# Patient Record
Sex: Female | Born: 1990 | Marital: Single
Health system: Southern US, Community
[De-identification: ages and names within clinical notes are randomized; demographics above are authoritative.]

## PROBLEM LIST (undated history)

## (undated) DIAGNOSIS — J45909 Unspecified asthma, uncomplicated: Secondary | ICD-10-CM

---

## 2013-04-06 ENCOUNTER — Inpatient Hospital Stay: Payer: Self-pay | Admitting: Surgery

## 2013-04-06 DIAGNOSIS — R109 Unspecified abdominal pain: Secondary | ICD-10-CM

## 2013-04-06 DIAGNOSIS — R42 Dizziness and giddiness: Secondary | ICD-10-CM

## 2013-04-06 LAB — URINALYSIS, COMPLETE
Bilirubin,UR: NEGATIVE
Blood: NEGATIVE
Glucose,UR: NEGATIVE mg/dL (ref 0–75)
KETONE: NEGATIVE
Leukocyte Esterase: NEGATIVE
Nitrite: NEGATIVE
PROTEIN: NEGATIVE
Ph: 7 (ref 4.5–8.0)
RBC,UR: <1 /HPF (ref 0–5)
Specific Gravity: 1.01 (ref 1.003–1.030)
Squamous Epithelial: 1
WBC UR: 1 /HPF (ref 0–5)

## 2013-04-06 LAB — CBC WITH DIFFERENTIAL/PLATELET
Basophil #: 0 10*3/uL (ref 0.0–0.1)
Basophil %: 0.2 %
EOS PCT: 0.3 %
Eosinophil #: 0 10*3/uL (ref 0.0–0.7)
HCT: 42.2 % (ref 35.0–47.0)
HGB: 13.9 g/dL (ref 12.0–16.0)
LYMPHS ABS: 1.4 10*3/uL (ref 1.0–3.6)
LYMPHS PCT: 11 %
MCH: 31.8 pg (ref 26.0–34.0)
MCHC: 33 g/dL (ref 32.0–36.0)
MCV: 96 fL (ref 80–100)
MONOS PCT: 6.1 %
Monocyte #: 0.8 x10 3/mm (ref 0.2–0.9)
NEUTROS ABS: 10.4 10*3/uL — AB (ref 1.4–6.5)
Neutrophil %: 82.4 %
Platelet: 257 10*3/uL (ref 150–440)
RBC: 4.38 10*6/uL (ref 3.80–5.20)
RDW: 12.8 % (ref 11.5–14.5)
WBC: 12.6 10*3/uL — ABNORMAL HIGH (ref 3.6–11.0)

## 2013-04-06 LAB — COMPREHENSIVE METABOLIC PANEL
Albumin: 4.2 g/dL (ref 3.4–5.0)
Alkaline Phosphatase: 81 U/L
Anion Gap: 4 — ABNORMAL LOW (ref 7–16)
BILIRUBIN TOTAL: 0.6 mg/dL (ref 0.2–1.0)
BUN: 13 mg/dL (ref 7–18)
CALCIUM: 9.3 mg/dL (ref 8.5–10.1)
CHLORIDE: 102 mmol/L (ref 98–107)
Co2: 31 mmol/L (ref 21–32)
Creatinine: 0.79 mg/dL (ref 0.60–1.30)
GLUCOSE: 90 mg/dL (ref 65–99)
Osmolality: 273 (ref 275–301)
POTASSIUM: 3.7 mmol/L (ref 3.5–5.1)
SGOT(AST): 23 U/L (ref 15–37)
SGPT (ALT): 20 U/L (ref 12–78)
Sodium: 137 mmol/L (ref 136–145)
TOTAL PROTEIN: 8.1 g/dL (ref 6.4–8.2)

## 2013-04-06 LAB — LIPASE, BLOOD: Lipase: 169 U/L (ref 73–393)

## 2013-04-07 LAB — CBC WITH DIFFERENTIAL/PLATELET
BASOS ABS: 0 10*3/uL (ref 0.0–0.1)
Basophil %: 0.2 %
Eosinophil #: 0 10*3/uL (ref 0.0–0.7)
Eosinophil %: 0.1 %
HCT: 28.7 % — AB (ref 35.0–47.0)
HGB: 9.7 g/dL — ABNORMAL LOW (ref 12.0–16.0)
LYMPHS ABS: 1.1 10*3/uL (ref 1.0–3.6)
LYMPHS PCT: 7.6 %
MCH: 32.4 pg (ref 26.0–34.0)
MCHC: 33.9 g/dL (ref 32.0–36.0)
MCV: 96 fL (ref 80–100)
MONO ABS: 1.2 x10 3/mm — AB (ref 0.2–0.9)
Monocyte %: 8.3 %
Neutrophil #: 12.3 10*3/uL — ABNORMAL HIGH (ref 1.4–6.5)
Neutrophil %: 83.8 %
PLATELETS: 217 10*3/uL (ref 150–440)
RBC: 3 10*6/uL — ABNORMAL LOW (ref 3.80–5.20)
RDW: 12.8 % (ref 11.5–14.5)
WBC: 14.7 10*3/uL — ABNORMAL HIGH (ref 3.6–11.0)

## 2013-04-08 LAB — CBC WITH DIFFERENTIAL/PLATELET
BASOS PCT: 0.5 %
Basophil #: 0 10*3/uL (ref 0.0–0.1)
Eosinophil #: 0.1 10*3/uL (ref 0.0–0.7)
Eosinophil %: 0.9 %
HCT: 26.1 % — AB (ref 35.0–47.0)
HGB: 8.7 g/dL — ABNORMAL LOW (ref 12.0–16.0)
LYMPHS ABS: 2.6 10*3/uL (ref 1.0–3.6)
Lymphocyte %: 41.5 %
MCH: 32.2 pg (ref 26.0–34.0)
MCHC: 33.5 g/dL (ref 32.0–36.0)
MCV: 96 fL (ref 80–100)
MONO ABS: 0.6 x10 3/mm (ref 0.2–0.9)
Monocyte %: 10.1 %
NEUTROS ABS: 2.9 10*3/uL (ref 1.4–6.5)
NEUTROS PCT: 47 %
Platelet: 186 10*3/uL (ref 150–440)
RBC: 2.71 10*6/uL — ABNORMAL LOW (ref 3.80–5.20)
RDW: 13 % (ref 11.5–14.5)
WBC: 6.3 10*3/uL (ref 3.6–11.0)

## 2013-04-09 LAB — HEMOGLOBIN: HGB: 9.8 g/dL — ABNORMAL LOW (ref 12.0–16.0)

## 2013-04-19 LAB — PATHOLOGY REPORT

## 2014-04-08 ENCOUNTER — Emergency Department (HOSPITAL_BASED_OUTPATIENT_CLINIC_OR_DEPARTMENT_OTHER)
Admission: RE | Admit: 2014-04-08 | Disposition: A | Discharge status: Home / Self Care | Payer: Self-pay | Source: Emergency Department | Attending: Emergency Medicine | Admitting: Emergency Medicine

## 2014-04-08 ENCOUNTER — Encounter (HOSPITAL_BASED_OUTPATIENT_CLINIC_OR_DEPARTMENT_OTHER): Payer: Self-pay

## 2014-04-08 HISTORY — DX: Unspecified asthma, uncomplicated: J45.909

## 2014-04-08 NOTE — ED Triage Note (Signed)
Pt BIBA for ETOH intoxication. Found in [facility] bathroom, difficult to arouse. Pt admits to drinking "too much," admits to 5 drinks, denies drug use. Pt tearful throughout triage and unable to answer most questions. MD at bedside.

## 2014-04-08 NOTE — ED Provider Notes (Signed)
I have reviewed the ED nursing notes and prior records. I have reviewed the patient's past medical history/problem list, allergies, social history and medication list. I saw this patient primarily.    HPI:    This 24 year old female presents with chief complaint of alcohol intoxication.  Patient was at a [employer] and employees called EMS because of patient's altered behavior.  Patient denies drug use.    ROS: Unable to fully assess secondary to level of intoxication..    Past Medical History/Problem List:    Past Medical History    Asthma      There is no problem list on file for this patient.    Past Surgical History:  History reviewed. No pertinent past surgical history.  Medications:     No current facility-administered medications on file prior to encounter.  No current outpatient prescriptions on file prior to encounter.  Social History:    Social History   Marital Status: Single  Spouse Name: N/A     Social History Main Topics   Smoking status: Never Smoker     Smokeless tobacco: Not on file    Alcohol Use: Yes    Drug Use: No     Allergies:  Review of Patient's Allergies indicates:  No Known Allergies  Physical Exam:  BP 108/67 mmHg   Pulse 92   Temp(Src) 97.4 F   Resp 14   Wt 61.236 kg (135 lb)   SpO2 100%   LMP  (LMP Unknown)  GENERAL:  Well-developed, alert & oriented x 4, crying, intoxicated  SKIN:  Warm & Dry, no rash, no bruising  HEAD:  Atraumatic. PERRL.   ENT: Oropharynx without tonsillar exudates or redness.  NECK:  Neck supple with full ROM.  No lymphadenopathy.  LUNGS:  Clear to auscultation bilaterally.   HEART:  RRR.  No murmur.   ABDOMEN:  Soft, without distension.  Nontender to palpation.  MUSCULOSKELETAL:  No deformities.  Normal pulses.  NEUROLOGIC: Slurred speech. Normal gait and station.   PSYCHIATRIC: Upset, crying    ED Course and Medical Decision-making:  Patient is a 24 year old female who admits to drinking this evening and was sent into the ED because of behavior  while in a grocery store.  Patient is crying and emotionally upset.  Unable to get good history.  Physical exam is unremarkable. Differential diagnosis includes hypoglycemia, head trauma, toxidrome, sepsis, and others. The history and physical exam support ethanol intoxication.  Patient was observed until more sober.  Family will be in to pick her up.  Reasons to return to the ED were reviewed in detail. The patient agrees with this plan and disposition.    Diagnosis/Diagnoses:  Alcohol intoxication, uncomplicated  (primary encounter diagnosis)    Verna Czechhomas Minervia Osso M.D.

## 2014-04-08 NOTE — Narrator Note (Signed)
Pt ambulatory in hallway with steady gait, father coming to pick her up.

## 2014-04-08 NOTE — Narrator Note (Signed)
Patient Disposition    Patient education for diagnosis, medications, activity, diet and follow-up.  Patient left ED 4:07 AM.  Patient rep received written instructions.  Interpreter to provide instructions: No    Patient belongings with patient: YES    Have all existing LDAs been addressed? N/A    Have all IV infusions been stopped? N/A    Discharged to: Discharged to home with father. Ambulated out with a steady gait.

## 2014-04-08 NOTE — Discharge Instructions (Signed)
Alcohol Intoxication  Alcohol intoxication occurs when the amount of alcohol that a person has consumed impairs his or her ability to mentally and physically function. Alcohol directly impairs the normal chemical activity of the brain. Drinking large amounts of alcohol can lead to changes in mental function and behavior, and it can cause many physical effects that can be harmful.   Alcohol intoxication can range in severity from mild to very severe. Various factors can affect the level of intoxication that occurs, such as the person's age, gender, weight, frequency of alcohol consumption, and the presence of other medical conditions (such as diabetes, seizures, or heart conditions). Dangerous levels of alcohol intoxication may occur when people drink large amounts of alcohol in a short period (binge drinking). Alcohol can also be especially dangerous when combined with certain prescription medicines or "recreational" drugs.  SIGNS AND SYMPTOMS  Some common signs and symptoms of mild alcohol intoxication include:  · Loss of coordination.  · Changes in mood and behavior.  · Impaired judgment.  · Slurred speech.  As alcohol intoxication progresses to more severe levels, other signs and symptoms will appear. These may include:  · Vomiting.  · Confusion and impaired memory.  · Slowed breathing.  · Seizures.  · Loss of consciousness.  DIAGNOSIS   Your health care provider will take a medical history and perform a physical exam. You will be asked about the amount and type of alcohol you have consumed. Blood tests will be done to measure the concentration of alcohol in your blood. In many places, your blood alcohol level must be lower than 80 mg/dL (0.08%) to legally drive. However, many dangerous effects of alcohol can occur at much lower levels.   TREATMENT   People with alcohol intoxication often do not require treatment. Most of the effects of alcohol intoxication are temporary, and they go away as the alcohol naturally  leaves the body. Your health care provider will monitor your condition until you are stable enough to go home. Fluids are sometimes given through an IV access tube to help prevent dehydration.   HOME CARE INSTRUCTIONS  · Do not drive after drinking alcohol.  · Stay hydrated. Drink enough water and fluids to keep your urine clear or pale yellow. Avoid caffeine.    · Only take over-the-counter or prescription medicines as directed by your health care provider.    SEEK MEDICAL CARE IF:   · You have persistent vomiting.    · You do not feel better after a few days.  · You have frequent alcohol intoxication. Your health care provider can help determine if you should see a substance use treatment counselor.  SEEK IMMEDIATE MEDICAL CARE IF:   · You become shaky or tremble when you try to stop drinking.    · You shake uncontrollably (seizure).    · You throw up (vomit) blood. This may be bright red or may look like black coffee grounds.    · You have blood in your stool. This may be bright red or may appear as a black, tarry, bad smelling stool.    · You become lightheaded or faint.    MAKE SURE YOU:   · Understand these instructions.  · Will watch your condition.  · Will get help right away if you are not doing well or get worse.  Document Released: 12/04/2004 Document Revised: 10/27/2012 Document Reviewed: 07/30/2012  ExitCare® Patient Information ©2015 [employer]. This information is not intended to replace advice given to you by your health care provider. Make sure   you discuss any questions you have with your health care provider.

## 2014-04-08 NOTE — Narrator Note (Signed)
Pt ambulated to bathroom with steady gait. 

## 2014-07-01 NOTE — H&P (Signed)
PATIENT NAME:  Yolanda Huynh, Yolanda Huynh MR#:  [id number] DATE OF BIRTH:  20-Apr-1990  DATE OF ADMISSION:  04/06/2013  PRIMARY CARE PHYSICIAN: Not local.   ADMITTING PHYSICIAN: Dr. Michela Pitcher.   CHIEF COMPLAINT: Abdominal pain and nausea.   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 She noted some periumbilical, midepigastric and generalized abdominal pain several days ago with some mild nausea. She did not vomit. She continued to have discomfort intermittently and then last evening had the pain migrate primarily the right lower quadrant. She had increase in her pain and had difficulty lying down or moving around. The nausea was worse but again, she did not vomit. She presented to the Emergency Room for further evaluation. Work-up in the ED revealed a slightly elevated white blood cell count of 12,600. Labs were otherwise unremarkable. She had a mild left shift. CT scan was performed which revealed an enhancing dilated appendix without any periappendiceal stranding  or evidence of rupture. She had bilateral adnexal fullness suggestive of multiple ovarian cysts. The surgical service was consulted.   She denies any history of previous GI problems. She denied a history of hepatitis, yellow jaundice, pancreatitis, peptic ulcer disease, gallbladder disease or diverticulitis. She did have a history of previous abdominal discomfort and was worked up with EGD. It proved to be unremarkable. She never had a colonoscopy. She denies any other previous surgery. She does not have any ovarian problems by history, although she does have a history of irregular periods.  She has no cardiac disease, hypertension, diabetes or thyroid problems. She takes only inhalers as her medication.   ALLERGIES:  She has no significant drug allergies.   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 She has problems with tightening of her throat  intermittently without a clear-cut source. She was undergoing biofeedback treatments in order to reduce the incidence of her problems. She has not had an episode in more than a year.   REVIEW OF SYSTEMS: Otherwise unremarkable.   FAMILY HISTORY: Noncontributory.   SOCIAL HISTORY:  She does not smoke cigarettes. Drinks alcohol intermittently.   PHYSICAL EXAMINATION: GENERAL: And she is an alert, pleasant young woman, who appears to be in no significant distress. She rates her pain at a 6.  VITAL SIGNS: Blood pressure is 110/60, heart rate 80 and regular. She is febrile to 100.  HEENT: No scleral icterus. No pupillary abnormalities or facial deformities.  NECK: Supple, nontender with a midline trachea and no adenopathy.  LUNGS:  Clear with no adventitious sounds. She has normal pulmonary excursion.  CARDIAC: Reveals no murmurs or gallops to my ear. She seems to be in normal sinus rhythm.  ABDOMEN: Generally soft. She has some point tenderness in the right lower quadrant. She has some mild guarding in the right lower quadrant and some referred rebound and right lower quadrant rebound. She has active bowel sounds. She has full range of motion, no deformities. EXTREMITIES:  Good distal pulses in her lower extremity. PSYCHIATRIC: Reveals normal orientation, normal affect.   IMPRESSION: I have independently reviewed her CT scan. She does have a mildly enhancing appendix if I am identifying it correctly on CT. I am concerned about bilateral adnexal masses suggestive of almost polycystic ovarian problems. She does have a  uterus flexed to the left side. In this situation with a dilated enhancing appendix, elevated white blood cell count and point tenderness in the  right lower quadrant, I would recommend laparoscopy. I told her there is the possibility of a negative appendix and I would recommend removing her appendix at that time even if her appendix was not involved. We would examine her pelvis at the same  time. She is concerned about surgical intervention and would like to ask about antibiotic therapy. I offered her option for admission and antibiotic evaluation with the caveat that there is an increased risk of rupture and resulting problems from that complication. She is going to make a decision in the next short period of time after discussing the problem with her parents. We will be available to assist her whatever matters necessary.    ____________________________ Quentin Orealph L. Ely III, MD rle:dp D: 04/06/2013 08:02:46 ET T: 04/06/2013 09:17:45 ET JOB#: [id number]  cc: Quentin Orealph L. Ely III, MD, <Dictator> Quentin OreALPH L ELY MD ELECTRONICALLY SIGNED 04/08/2013 18:08

## 2014-07-01 NOTE — Discharge Summary (Signed)
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 CT scan confirmed the probable diagnosis. Appropriate preoperative preparation and informed consent, she was taken to surgery where she underwent laparoscopic appendectomy. Ovaries were examined as well as her tubes, and there did not appear to be any evidence of any abnormality. The appendix was dilated, injected, and inflamed. The appendix was removed without incident. The night after surgery, she had a syncopal spell with significant bradycardia and hypotension. She was felt to have suffered a vasovagal attack. However, hemoglobin the following day was 9.7, down from an admission mark of 13 grams. We treated her with IV rehydration and continued to follow her. She had mild nausea and mild lightheadedness. On the 30th, her hemoglobin was 8.7. I discussed the possibility of continued bleeding process with the family, being concerned that perhaps she was bleeding from a port site or from the mesoappendix. Ultrasound was performed that afternoon at the family's request, which did show a significant amount of fluid in the pelvis consistent with blood loss. She remained asymptomatic overnight. Her nausea has improved. Her hemoglobin is 9.8 this morning, which suggested that perhaps the 8.7 value yesterday was a bit spurious. She is no longer having any lightheadedness. She is tolerating a regular diet. We will discharge her home today with the plan of following her up in the office in 7 to 10 days' time. Bathing, activity, and driving instructions were given to the patient.   DISCHARGE MEDICATIONS: Include only her Norco every 6 hours p.r.n.   FINAL DISCHARGE DIAGNOSIS: Acute appendicitis.   SURGERY: Laparoscopic  appendectomy.   ____________________________ Quentin Orealph L. Ely III, MD rle:jcm D: 04/09/2013 14:06:08 ET T: 04/09/2013 19:24:01 ET JOB#: [id number]  cc: Quentin Orealph L. Ely III, MD, <Dictator> Quentin OreALPH L ELY MD ELECTRONICALLY SIGNED 04/18/2013 22:53

## 2014-07-01 NOTE — Op Note (Signed)
PATIENT NAME:  Yolanda Huynh, Yolanda Huynh AmasBROOKE E MR#:  [id number] DATE OF BIRTH:  [birth year]  DATE OF PROCEDURE:  04/06/2013  PREOPERATIVE DIAGNOSIS: Appendicitis.   POSTOPERATIVE DIAGNOSIS: Appendicitis.   OPERATION:  Laparoscopic appendectomy.   ANESTHESIA: General.   SURGEON: Quentin Orealph L. Ely, MD   OPERATIVE PROCEDURE: With the patient in supine position after induction of appropriate general anesthesia, the patient's abdomen was prepped with ChloraPrep and draped with sterile towels. She was placed in a head-down/feet-up position. A small infraumbilical incision was made in standard fashion and carried down bluntly through subcutaneous tissue. A Veress needle was used to cannulate the peritoneal cavity, and CO2 was insufflated to appropriate pressure measurements. When approximately 2.5 liters of CO2 were instilled, the Veress needle was withdrawn  and an [employer] port inserted in the peritoneal cavity. Intraperitoneal position was confirmed, and CO2 was re-insufflated. The right upper quadrant transverse incision was made, and an 11 mm port inserted under direct vision. The right lower quadrant was interrogated. The appendix did appear to be injected, thickened and inflamed. The mesentery did not appear to be abnormal. The cecum did not have any significant inflammatory changes. The patient was placed in fairly steep Trendelenburg, and the pelvis examined. Left and right ovaries were both identified. They were large, but did not show any evidence of any significant neoplasm, and they both appeared to be functional and normal size for her age. There was some purulent fluid on the right side. Both ovaries, the pelvis, and the appendix were photographed. A left lower quadrant transverse incision was made, and a 12 mm port inserted under direct vision. The base of the appendix was identified. The mesoappendix was taken out with 2 applications of the Endo GIA stapling device carrying a white load, and the base of  appendix was amputated with a single application of the Endo GIA stapler carrying a blue load. The appendix was captured with Endo Catch apparatus and removed without difficulty. Left lower quadrant incision fascia was closed with a 0 Vicryl in a vertical mattress fashion using the suture passer. The midline fascia and the umbilicus and right upper quadrant incision were both closed with through-and-through sutures of 0 Vicryl. Skin was closed with Monocryl, Dermabond, and nylon in the umbilicus. Sterile dressings were applied. The patient was returned to the recovery room, having tolerated the procedure well. Sponge and instrument count were correct x 2 in the operating room.    ____________________________ Carmie Endalph L. Ely III, MD rle:mr D: 04/06/2013 16:39:13 ET T: 04/06/2013 19:29:48 ET JOB#: [id number]  cc: Carmie Endalph L. Ely III, MD, <Dictator> Dr. Dennie FettersMichael Gordon, [place]  Quentin OreALPH L ELY MD ELECTRONICALLY SIGNED 04/08/2013 18:09

## 2014-11-26 IMAGING — CT CT ABD-PELV W/ CM
2 of 4 series · 17 of 46 positions shown, 19 images · IV contrast (isovue)
Comparison: None.

CLINICAL DATA: Right lower quadrant pain, leukocytosis

EXAM:
CT ABDOMEN AND PELVIS WITH CONTRAST
TECHNIQUE: Multidetector CT imaging of the abdomen and pelvis was performed
using the standard protocol following bolus administration of
intravenous contrast.
CONTRAST:  100 cc Isovue 370

[Series 2: routine abd pel with · axial · 0.61mm/px · z∈[-1004,-584]mm · 14 of 92 slices shown, 16 images]
[im 4/92  soft-tissue]
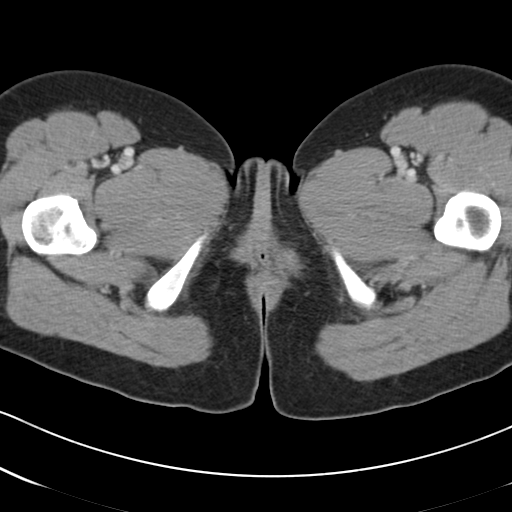
[im 4/92  bone]
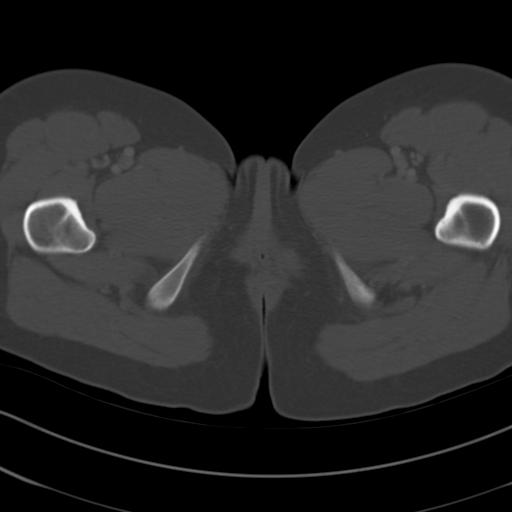
[im 11/92  soft-tissue]
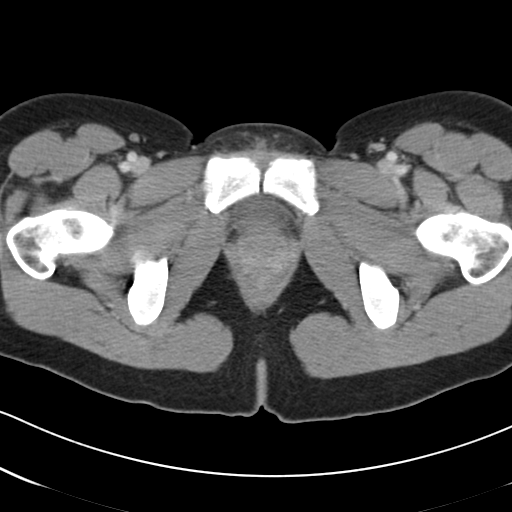
[im 19/92  soft-tissue]
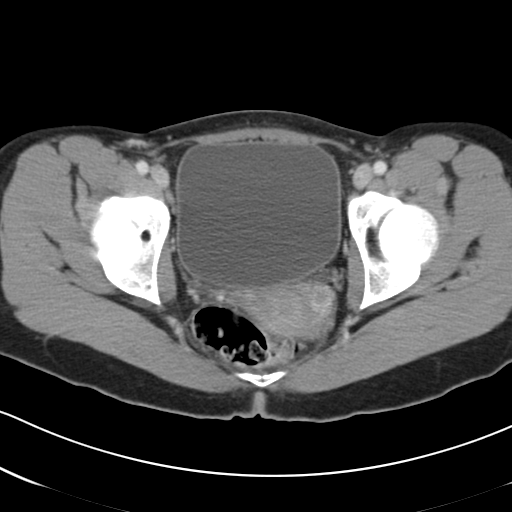
[im 26/92  soft-tissue]
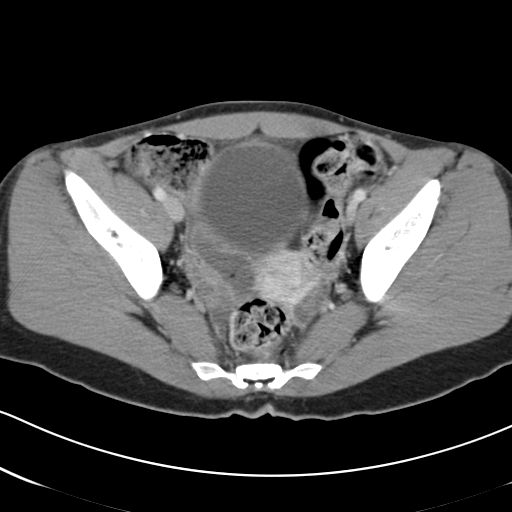
[im 30/92  soft-tissue]
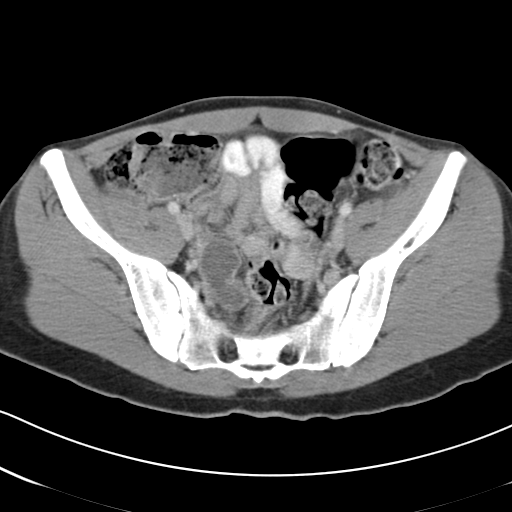
[im 37/92  soft-tissue]
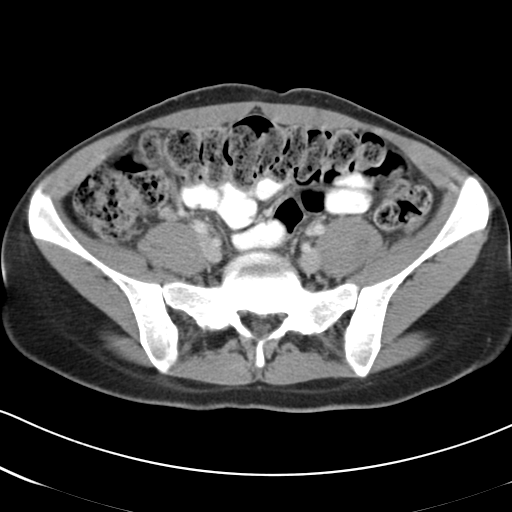
[im 44/92  soft-tissue]
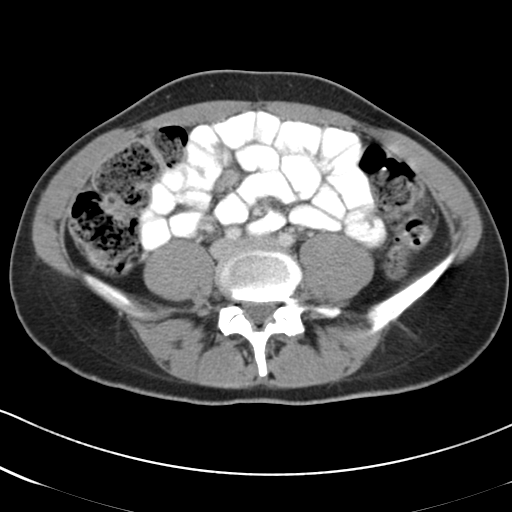
[im 48/92  soft-tissue]
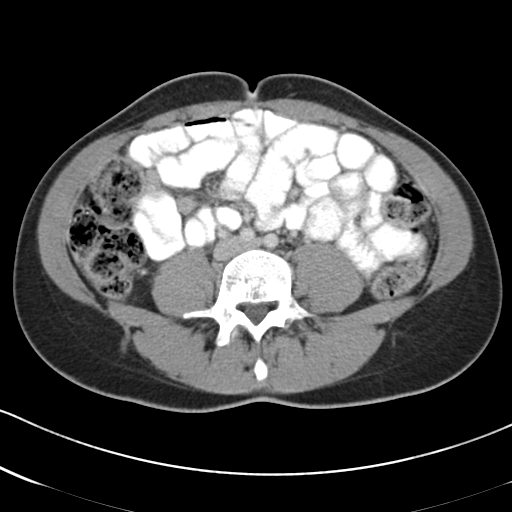
[im 55/92  soft-tissue]
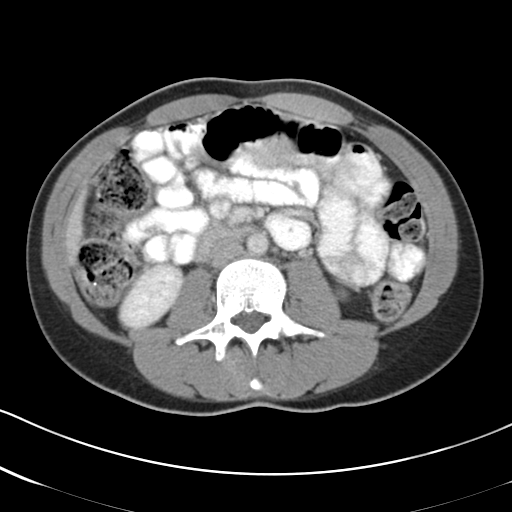
[im 55/92  bone]
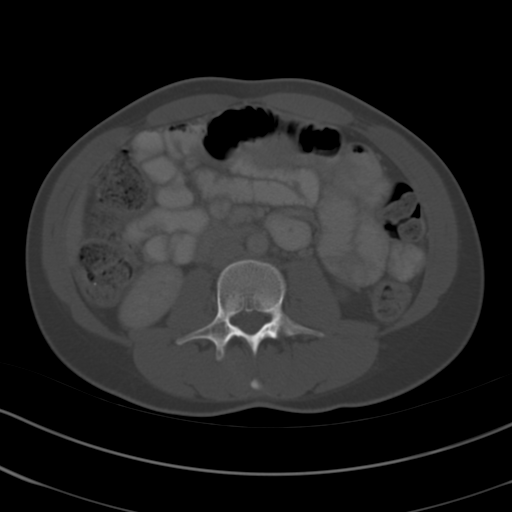
[im 62/92  soft-tissue]
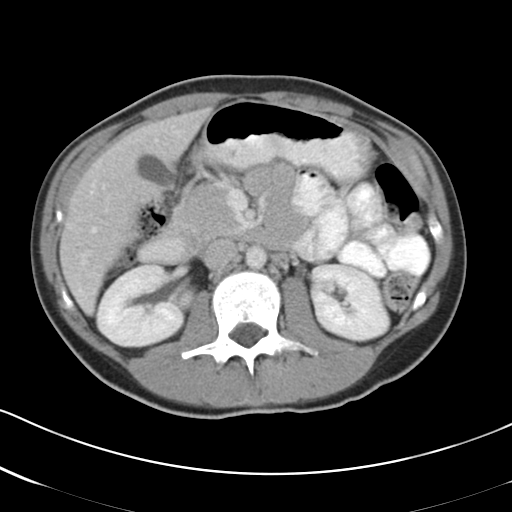
[im 70/92  soft-tissue]
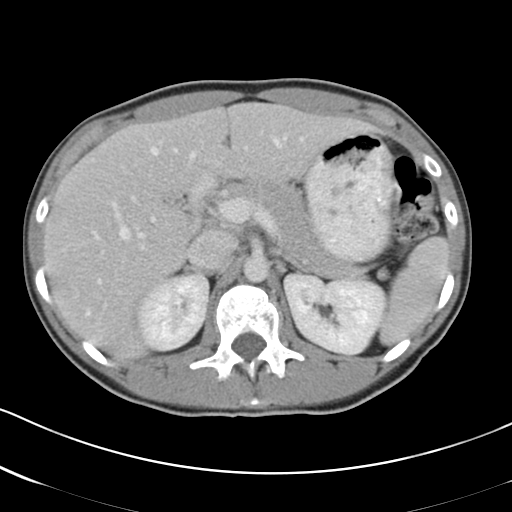
[im 73/92  soft-tissue]
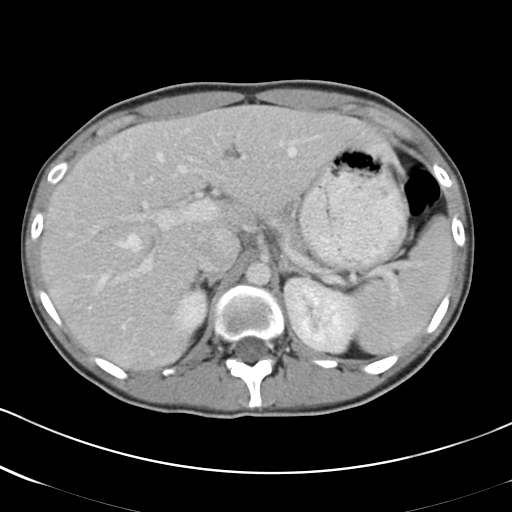
[im 81/92  soft-tissue]
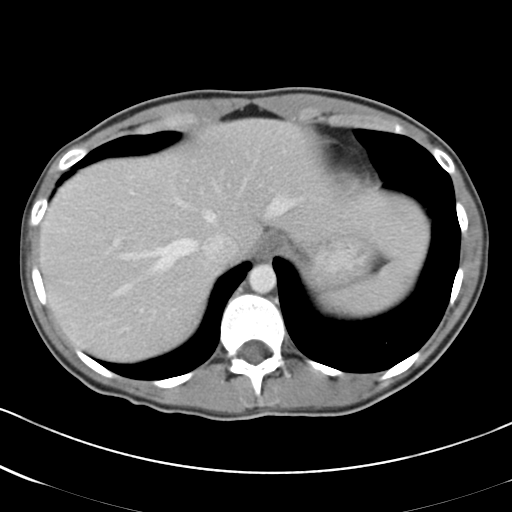
[im 88/92  soft-tissue]
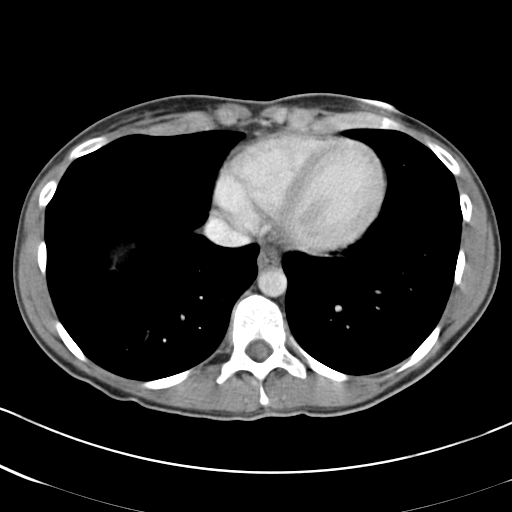

[Series 5: cor routine abd pel with · coronal · 0.91mm/px · 3 of 105 slices shown]
[im 35/105  soft-tissue]
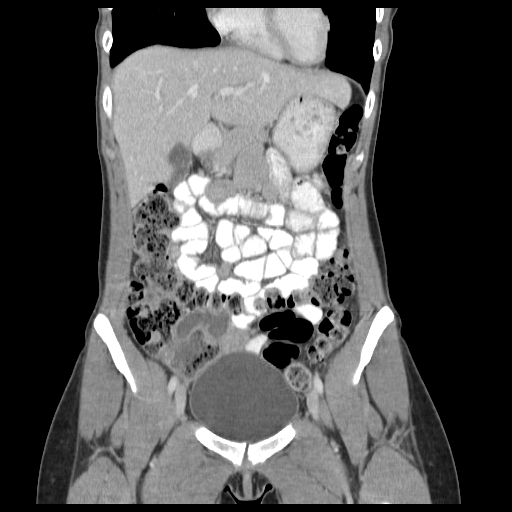
[im 47/105  soft-tissue]
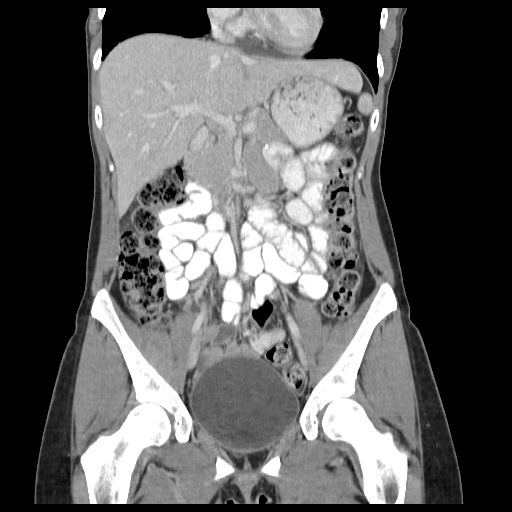
[im 58/105  soft-tissue]
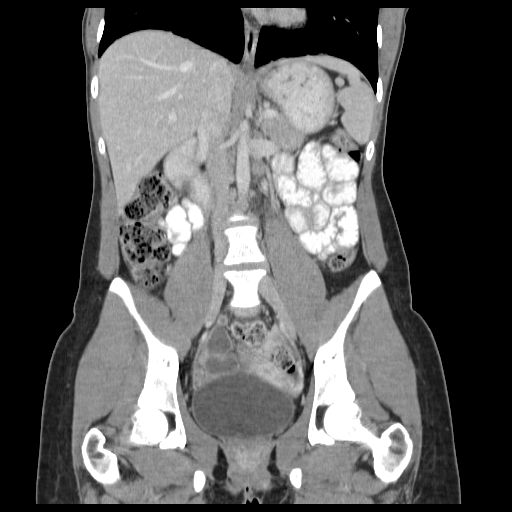

[17 of 46 positions shown; findings below may reference images not displayed]

FINDINGS: Linear opacity within the left lower lobe is favored to reflect
atelectasis or scarring. Trace pericardial fluid. Normal heart size.

No appreciable abnormality of the liver, spleen, pancreas, adrenal
glands. No radiodense gallstones or biliary ductal dilatation.

Symmetric renal enhancement.  No hydroureteronephrosis.

No overt colitis. Punctate high-density within the cecum on image 62
is near calcific density. The appendix shows mild wall enhancement
and distention up to 9 mm on coronal image 58. No periappendiceal
fat stranding or fluid. Small bowel loops are of normal course and
caliber. No lymphadenopathy.

Normal caliber aorta and branch vessels.

Thin walled bladder. CT appearance to the uterus and adnexa within
normal limits.

No acute osseous finding.
IMPRESSION: Mild distention and mucosal hyperenhancement of the appendix. No
periappendiceal fat stranding or fluid. Early/mild appendicitis not
excluded. Recommend surgical consultation.

Discussed via telephone with Dr. Hilson at [DATE] a.m. on 04/06/2013.
